# Patient Record
Sex: Male | Born: 2000 | Race: White | Hispanic: No | Marital: Single | Smoking: Current every day smoker
Health system: Southern US, Community
[De-identification: ages and names within clinical notes are randomized; demographics above are authoritative.]

---
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
## 2020-08-30 ENCOUNTER — Other Ambulatory Visit: Payer: Self-pay | Admitting: Family Medicine

## 2020-08-30 ENCOUNTER — Ambulatory Visit
Admission: RE | Admit: 2020-08-30 | Discharge: 2020-08-30 | Disposition: A | Discharge status: Home / Self Care | Payer: Medicaid Other | Source: Ambulatory Visit | Attending: Family Medicine | Admitting: Family Medicine

## 2020-08-30 DIAGNOSIS — R918 Other nonspecific abnormal finding of lung field: Secondary | ICD-10-CM

## 2020-09-10 ENCOUNTER — Emergency Department (HOSPITAL_COMMUNITY): Payer: Medicaid Other

## 2020-09-10 ENCOUNTER — Encounter (HOSPITAL_COMMUNITY): Payer: Self-pay

## 2020-09-10 ENCOUNTER — Other Ambulatory Visit: Payer: Self-pay

## 2020-09-10 ENCOUNTER — Emergency Department (HOSPITAL_COMMUNITY)
Admission: EM | Admit: 2020-09-10 | Discharge: 2020-09-10 | Disposition: A | Discharge status: Home / Self Care | Payer: Medicaid Other | Attending: Emergency Medicine | Admitting: Emergency Medicine

## 2020-09-10 DIAGNOSIS — B349 Viral infection, unspecified: Secondary | ICD-10-CM | POA: Insufficient documentation

## 2020-09-10 DIAGNOSIS — R590 Localized enlarged lymph nodes: Secondary | ICD-10-CM | POA: Insufficient documentation

## 2020-09-10 DIAGNOSIS — F17290 Nicotine dependence, other tobacco product, uncomplicated: Secondary | ICD-10-CM | POA: Diagnosis not present

## 2020-09-10 DIAGNOSIS — Z20822 Contact with and (suspected) exposure to covid-19: Secondary | ICD-10-CM | POA: Insufficient documentation

## 2020-09-10 DIAGNOSIS — R509 Fever, unspecified: Secondary | ICD-10-CM | POA: Diagnosis present

## 2020-09-10 LAB — CBC WITH DIFFERENTIAL/PLATELET
Abs Immature Granulocytes: 0.12 10*3/uL — ABNORMAL HIGH (ref 0.00–0.07)
Basophils Absolute: 0 10*3/uL (ref 0.0–0.1)
Basophils Relative: 0 %
Eosinophils Absolute: 0 10*3/uL (ref 0.0–0.5)
Eosinophils Relative: 0 %
HCT: 45 % (ref 39.0–52.0)
Hemoglobin: 16.2 g/dL (ref 13.0–17.0)
Immature Granulocytes: 1 %
Lymphocytes Relative: 9 %
Lymphs Abs: 1.2 10*3/uL (ref 0.7–4.0)
MCH: 31.1 pg (ref 26.0–34.0)
MCHC: 36 g/dL (ref 30.0–36.0)
MCV: 86.4 fL (ref 80.0–100.0)
Monocytes Absolute: 1.4 10*3/uL — ABNORMAL HIGH (ref 0.1–1.0)
Monocytes Relative: 10 %
Neutro Abs: 11.6 10*3/uL — ABNORMAL HIGH (ref 1.7–7.7)
Neutrophils Relative %: 80 %
Platelets: 191 10*3/uL (ref 150–400)
RBC: 5.21 MIL/uL (ref 4.22–5.81)
RDW: 12.3 % (ref 11.5–15.5)
WBC: 14.4 10*3/uL — ABNORMAL HIGH (ref 4.0–10.5)
nRBC: 0 % (ref 0.0–0.2)

## 2020-09-10 LAB — COMPREHENSIVE METABOLIC PANEL
ALT: 32 U/L (ref 0–44)
AST: 20 U/L (ref 15–41)
Albumin: 4.2 g/dL (ref 3.5–5.0)
Alkaline Phosphatase: 72 U/L (ref 38–126)
Anion gap: 9 (ref 5–15)
BUN: 11 mg/dL (ref 6–20)
CO2: 23 mmol/L (ref 22–32)
Calcium: 9.3 mg/dL (ref 8.9–10.3)
Chloride: 105 mmol/L (ref 98–111)
Creatinine, Ser: 0.82 mg/dL (ref 0.61–1.24)
GFR, Estimated: >60 mL/min (ref >60)
Glucose, Bld: 122 mg/dL — ABNORMAL HIGH (ref 70–99)
Potassium: 3.7 mmol/L (ref 3.5–5.1)
Sodium: 137 mmol/L (ref 135–145)
Total Bilirubin: 1.2 mg/dL (ref 0.3–1.2)
Total Protein: 7.7 g/dL (ref 6.5–8.1)

## 2020-09-10 LAB — RESP PANEL BY RT-PCR (FLU A&B, COVID) ARPGX2
Influenza A by PCR: NEGATIVE
Influenza B by PCR: NEGATIVE
SARS Coronavirus 2 by RT PCR: NEGATIVE

## 2020-09-10 LAB — URINALYSIS, ROUTINE W REFLEX MICROSCOPIC
Bilirubin Urine: NEGATIVE
Glucose, UA: NEGATIVE mg/dL
Hgb urine dipstick: NEGATIVE
Ketones, ur: 5 mg/dL — AB
Leukocytes,Ua: NEGATIVE
Nitrite: NEGATIVE
Protein, ur: NEGATIVE mg/dL
Specific Gravity, Urine: 1.01 (ref 1.005–1.030)
pH: 6 (ref 5.0–8.0)

## 2020-09-10 LAB — MONONUCLEOSIS SCREEN: Mono Screen: NEGATIVE

## 2020-09-10 MED ORDER — ACETAMINOPHEN 325 MG PO TABS
650.0000 mg | ORAL_TABLET | Freq: Once | ORAL | Status: AC
  Administered 2020-09-10: 650 mg via ORAL
  Filled 2020-09-10: qty 2

## 2020-09-10 NOTE — ED Notes (Signed)
Patient was given DC instructions. Grandmother did not seem pleased with results and stated she would go somewhere else.

## 2020-09-10 NOTE — ED Triage Notes (Signed)
Pt sent to ER from [facility] with abnormal bilirubin levels. Pt also c/o chills and back pain. 1 episode of vomiting. Fever for 3 days.

## 2020-09-10 NOTE — ED Notes (Signed)
Patient in room, in gown with grandmother at bedside. Pt states he feels pressure on his bladder and is having urinary frequency. Reports pain to his back head. Patient arrived from [facility] after having abnormal labs.

## 2020-09-10 NOTE — Discharge Instructions (Signed)
Drink plenty of fluids.  Take Tylenol for fevers and aches follow-up with your primary care doctor if any problems.  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

## 2020-09-10 NOTE — ED Provider Notes (Signed)
[facility]-EMERGENCY DEPT Provider Note   CSN: [id number] Arrival date & time: 09/10/20  2056     History Chief Complaint  Patient presents with  . Abnormal Lab    Alejandro Monroe is a 20 y.o. male.  Patient complains of fevers aches some pain in the anterior neck..  Patient has a history of abusing Afrin.  No vomiting no cough  The history is provided by the patient and a relative. No language interpreter was used.  Fever Temp source:  Oral Severity:  Moderate Onset quality:  Sudden Timing:  Constant Progression:  Waxing and waning Chronicity:  New Relieved by:  Nothing Worsened by:  Nothing Ineffective treatments:  None tried Associated symptoms: no chest pain, no congestion, no cough, no diarrhea, no headaches and no rash        History reviewed. No pertinent past medical history.  Patient Active Problem List   Diagnosis Date Noted  . CAP (community acquired pneumonia) 04/18/2018  . Acute respiratory failure with hypoxia (HCC)   . Acute lung injury     History reviewed. No pertinent surgical history.     No family history on file.  Social History   Tobacco Use  . Smoking status: Current Every Day Smoker    Types: E-cigarettes  . Smokeless tobacco: Never Used  Vaping Use  . Vaping Use: Every day  . Substances: THC  Substance Use Topics  . Alcohol use: Not Currently  . Drug use: Yes    Types: Marijuana    Home Medications Prior to Admission medications   Not on File    Allergies    Patient has no known allergies.  Review of Systems   Review of Systems  Constitutional: Positive for fever. Negative for appetite change and fatigue.  HENT: Negative for congestion, ear discharge and sinus pressure.   Eyes: Negative for discharge.  Respiratory: Negative for cough.   Cardiovascular: Negative for chest pain.  Gastrointestinal: Negative for abdominal pain and diarrhea.  Genitourinary: Negative for frequency and hematuria.   Musculoskeletal: Negative for back pain.  Skin: Negative for rash.  Neurological: Negative for seizures and headaches.  Psychiatric/Behavioral: Negative for hallucinations.    Physical Exam Updated Vital Signs BP (!) 108/55   Pulse 94   Temp 99.1 F (37.3 C) (Oral)   Resp 19   Ht 5\' 9"  (1.753 m)   Wt 77.6 kg   SpO2 98%   BMI 25.25 kg/m   Physical Exam Vitals and nursing note reviewed.  Constitutional:      Appearance: He is well-developed.  HENT:     Head: Normocephalic.     Nose: Nose normal.  Eyes:     General: No scleral icterus.    Conjunctiva/sclera: Conjunctivae normal.  Neck:     Thyroid: No thyromegaly.     Comments: Tender left side cervical nodes Cardiovascular:     Rate and Rhythm: Normal rate and regular rhythm.     Heart sounds: No murmur heard. No friction rub. No gallop.   Pulmonary:     Breath sounds: No stridor. No wheezing or rales.  Chest:     Chest wall: No tenderness.  Abdominal:     General: There is no distension.     Tenderness: There is no abdominal tenderness. There is no rebound.  Musculoskeletal:        General: Normal range of motion.     Cervical back: Neck supple.  Lymphadenopathy:     Cervical: Cervical adenopathy  present.  Skin:    Findings: No erythema or rash.  Neurological:     Mental Status: He is alert and oriented to person, place, and time.     Motor: No abnormal muscle tone.     Coordination: Coordination normal.  Psychiatric:        Behavior: Behavior normal.     ED Results / Procedures / Treatments   Labs (all labs ordered are listed, but only abnormal results are displayed) Labs Reviewed  CBC WITH DIFFERENTIAL/PLATELET - Abnormal; Notable for the following components:      Result Value   WBC 14.4 (*)    Neutro Abs 11.6 (*)    Monocytes Absolute 1.4 (*)    Abs Immature Granulocytes 0.12 (*)    All other components within normal limits  COMPREHENSIVE METABOLIC PANEL - Abnormal; Notable for the following  components:   Glucose, Bld 122 (*)    All other components within normal limits  URINALYSIS, ROUTINE W REFLEX MICROSCOPIC - Abnormal; Notable for the following components:   Ketones, ur 5 (*)    All other components within normal limits  RESP PANEL BY RT-PCR (FLU A&B, COVID) [id number]  MONONUCLEOSIS SCREEN    EKG None  Radiology DG Chest 2 View  Result Date: 09/10/2020 CLINICAL DATA:  Shortness of breath vomiting and fever EXAM: CHEST - 2 VIEW COMPARISON:  08/30/2020 FINDINGS: The heart size and mediastinal contours are within normal limits. Both lungs are clear. The visualized skeletal structures are unremarkable. IMPRESSION: No active cardiopulmonary disease. Electronically Signed   By: Jasmine Pang M.D.   On: 09/10/2020 22:07    Procedures Procedures   Medications Ordered in ED Medications  acetaminophen (TYLENOL) tablet 650 mg (650 mg Oral Given 09/10/20 2229)    ED Course  I have reviewed the triage vital signs and the nursing notes.  Pertinent labs & imaging results that were available during my care of the patient were reviewed by me and considered in my medical decision making (see chart for details).    MDM Rules/Calculators/A&P                          Patient with febrile illness.  Labs unremarkable except for minimally elevated white count.  He will follow-up with her primary care doctor if not improving and also he has been referred to ENT for this chronic Afrin use.  Patient is to drink fluids and take Tylenol Final Clinical Impression(s) / ED Diagnoses Final diagnoses:  Viral syndrome    Rx / DC Orders ED Discharge Orders    None       Bethann Berkshire, MD 09/11/20 1029

## 2021-12-25 IMAGING — CR DG CHEST 2V
2 series · 2 of 2 positions shown · non-contrast
Comparison: 08/30/2020

CLINICAL DATA: Shortness of breath vomiting and fever

EXAM:
CHEST - 2 VIEW

[w chest pa]
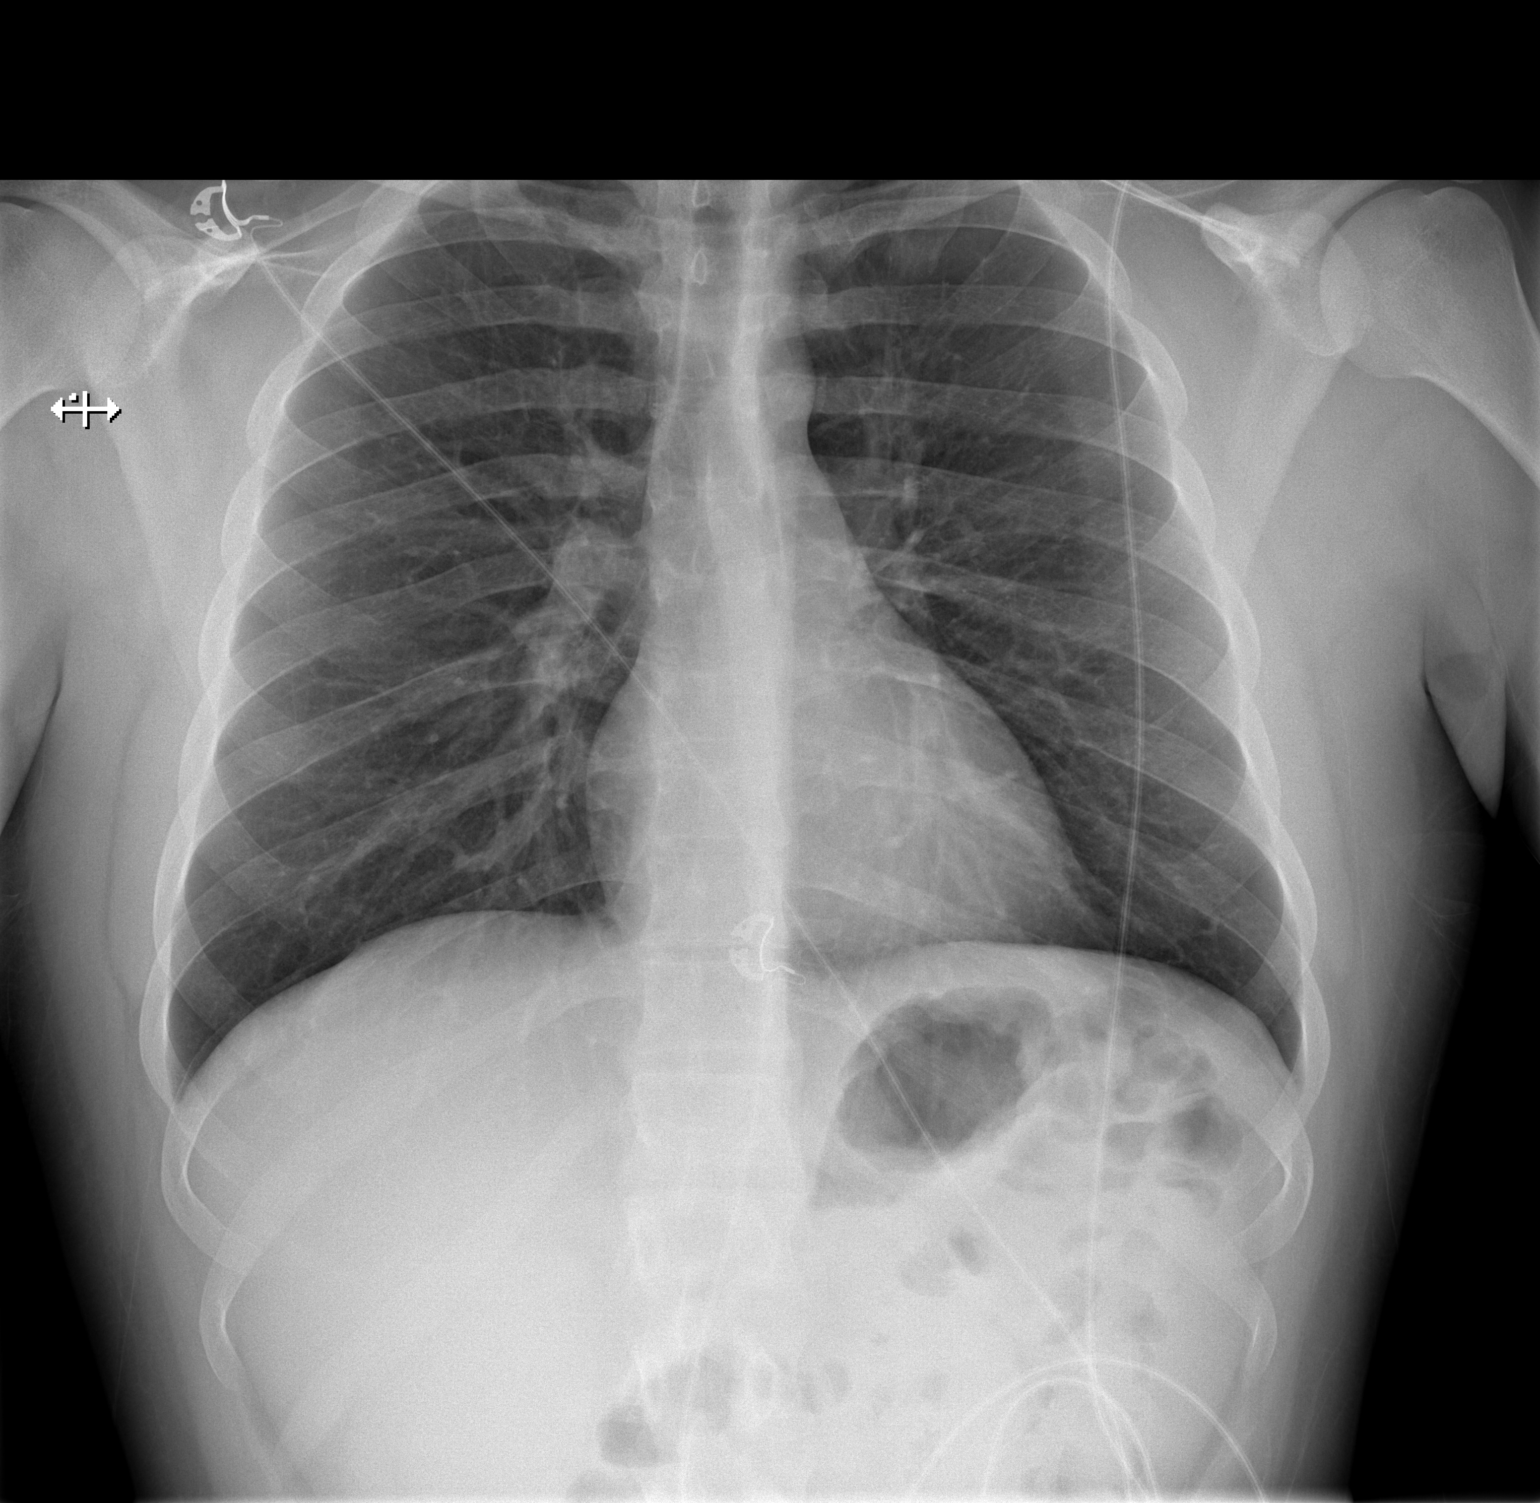

[w chest lat]
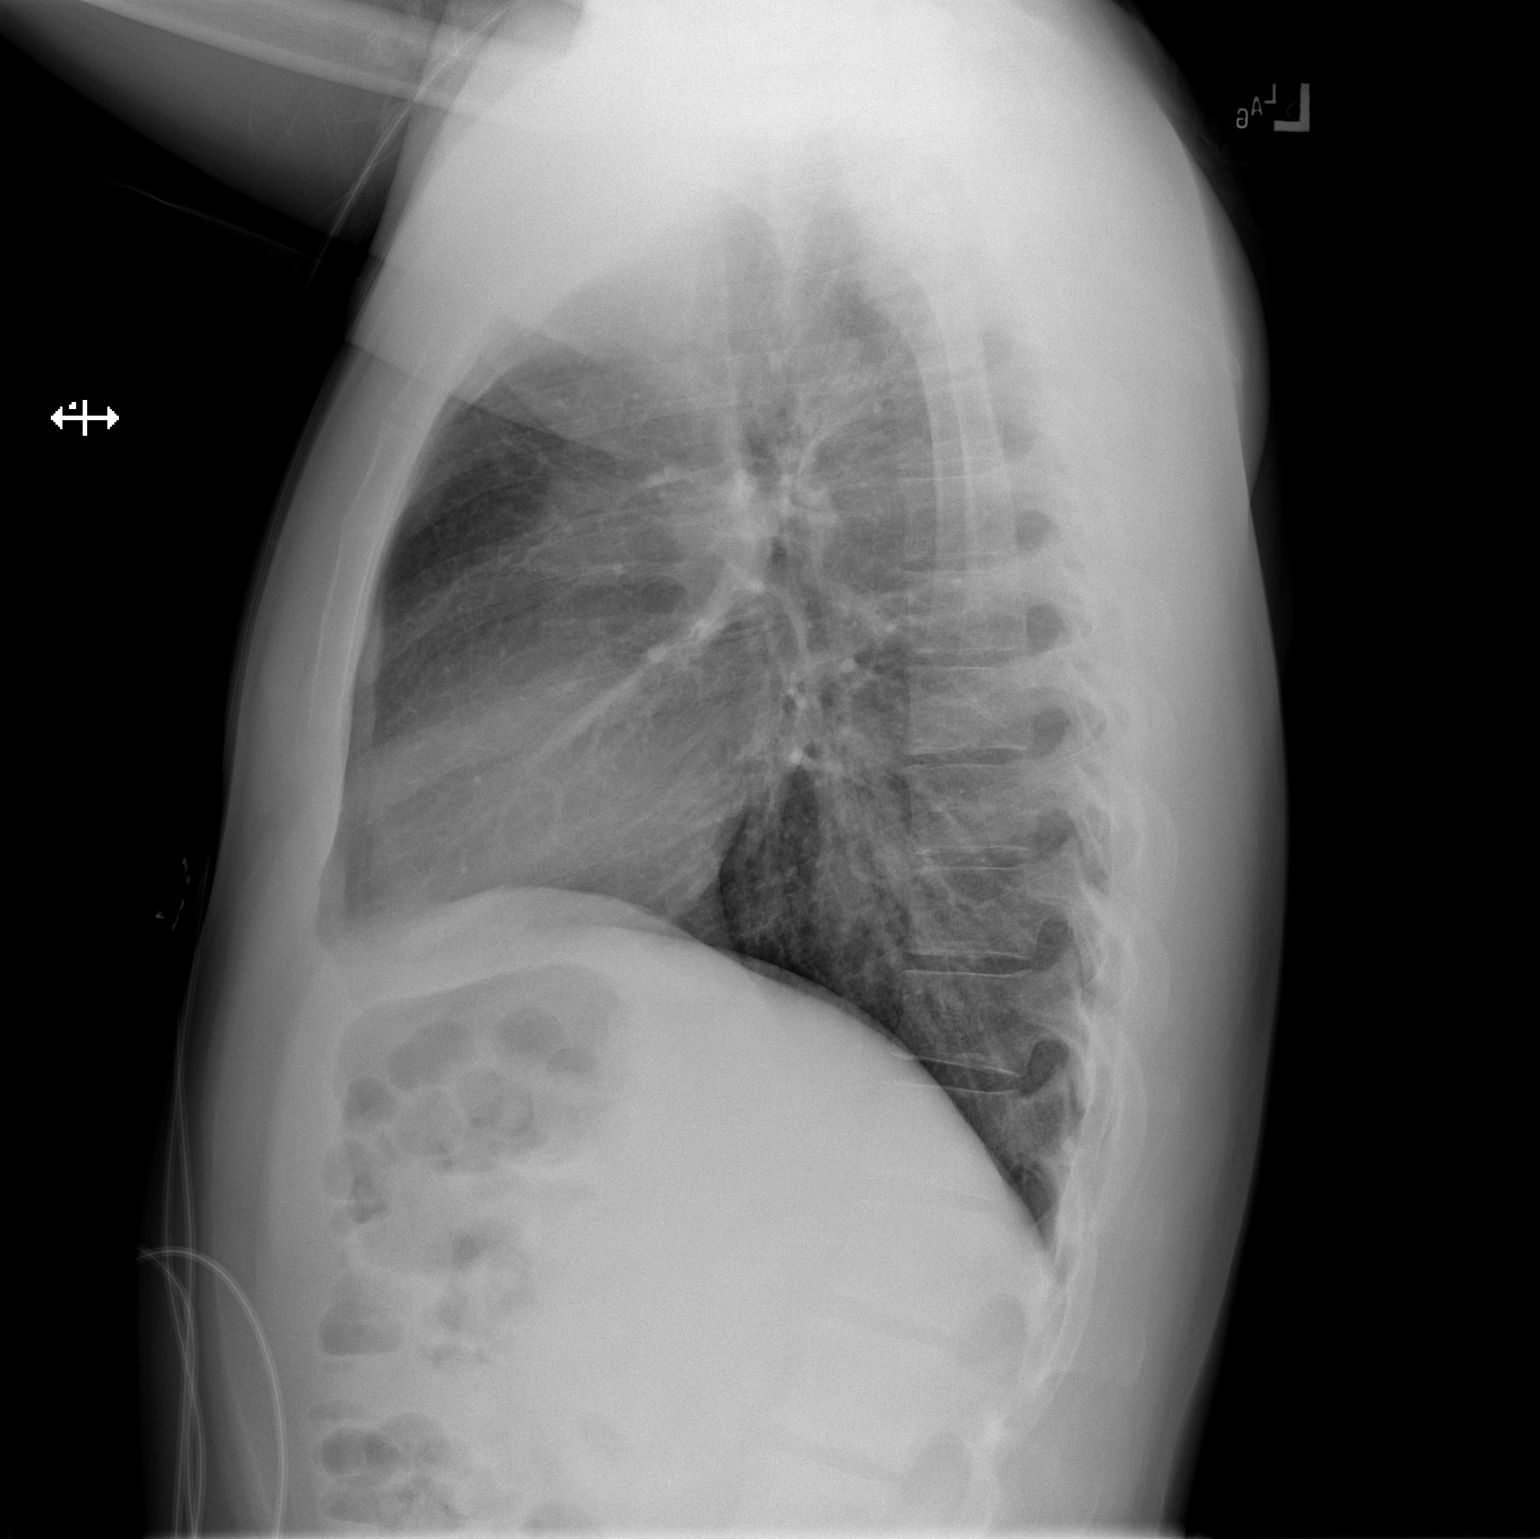

[2 of 2 positions shown; findings below may reference images not displayed]

FINDINGS: The heart size and mediastinal contours are within normal limits.
Both lungs are clear. The visualized skeletal structures are
unremarkable.
IMPRESSION: No active cardiopulmonary disease.
